# Patient Record
Sex: Male | Born: 1995 | Hispanic: Yes | Marital: Single | Smoking: Never smoker
Health system: Southern US, Community
[De-identification: ages and names within clinical notes are randomized; demographics above are authoritative.]

---
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
## 2016-06-06 ENCOUNTER — Ambulatory Visit: Payer: Self-pay | Admitting: Dietician

## 2016-07-22 ENCOUNTER — Encounter: Payer: Self-pay | Admitting: Dietician

## 2016-07-22 ENCOUNTER — Encounter: Payer: BLUE CROSS/BLUE SHIELD | Attending: Pediatrics | Admitting: Dietician

## 2016-07-22 VITALS — Ht 69.0 in | Wt 261.4 lb

## 2016-07-22 DIAGNOSIS — E669 Obesity, unspecified: Secondary | ICD-10-CM

## 2016-07-22 NOTE — Progress Notes (Signed)
Medical Nutrition Therapy: Visit start time: 8:30   end time: 9:30 Assessment:  Diagnosis: obesity Psychosocial issues/ stress concerns: none identified Preferred learning method:  . Auditory . Hands-on  Current weight: 261.4 lbs  Height: 69 in Medications, supplements: none Progress and evaluation:   Patient accompanied by his mother in for initial medical nutrition therapy assessment. Patient reports his rate of weight gain increased in middle school. He has not made any diet changes. He identifies a high intake of "Fast Foods" as a problem area. He works 3rd shift and often picks up fast food on his way home from work and often again for his dinner meal as well as meal while at work. He drinks water at home but tea or soda with "take out" meals.  His mother expressed a concern about his weight due to strong family history of diabetes; both of her parents and her brother and sister. She prepares an evening meal, but he usually chooses to pick up "Fast Food". He rates his motivation to make diet changes as 0-1 on a 0-10 scale but by the end of the session, he willingly set some goals. His present diet is low in fruits, vegetables and  whole grains and is adequate in protein and excessive in fat. He likes very few vegetables but states he loves Physical activity:  Asiyah has been going with his mother to the park to walk on a daily basis for 1-2 hours.  Dietary Intake:  Usual eating pattern includes 3 meals and 1-2 snacks per day. Dining out frequency: 11-75meals per week.  Breakfast: 7:30- sausage biscuit, hash browns, or.juice Goes to park to walk. Sleeps from 10:00- 3:00pm Dinner: 4-5:00pm- fish or shrimp, rice or tacos at home or [employer], 4-5 days/week ([employer], [employer], Cook-out, all with fried foods, tea or soda Meal at work, 1:00am- Fast Foods-4-5 nights per week or leftovers from home Beverages: water or 2% milk at home, sweet tea and soda when eating "out".  Nutrition Care  Education: Weight control: Discussed how goals for change have to be set by patient. Discussed how a rigid restrictive diet will not work long term and recommended to start with present intake and preferences and begin process of mindful eating. Discussed how positive diet and exercise changes can  decrease risk of diabetes in addition to aiding in weight loss efforts.  Meal by meal, discussed realistic changes that patient felt he could make to lower fat calories and also increase fruits/vegetables.  Nutritional Diagnosis:  [place]-3.3 Overweight/obesity As related to frequent intake of high fat "fast foods" and sweetened beverages.  As evidenced by diet history..  Intervention:  The following are the changes that [facility] felt he could realistically make: Walk daily for 1 1/2 to 2 hours. Increase water and eliminate sweetened beverages. Choose lower fat fast foods such as egg on English muffin in place of sausage biscuit. If having one high fat fast food, bring home and balance with fruit, celery (or any other vegetable). Add a glass of milk. Also, after mother asked about the hospital's [employer] program, gave [place] a coupon he can use for a "free week" to see if he is interested in the program.  Insurance underwriter given:  . Food lists/ Planning A Balanced Meal . Sample meal pattern/ menus . Goals/ instructions Learner/ who was taught:  . Patient  . Family member: mother Level of understanding: Verbalizes understanding Learning barriers: . None  Willingness to learn/ readiness for change: . Hesitance, contemplating  change Monitoring and Evaluation:  Dietary intake, exercise, , and body weight      follow up: 08/19/16 at 8:15am

## 2016-07-22 NOTE — Patient Instructions (Signed)
Walk daily for 1 1/2 to 2 hours. Increase water and eliminate sweetened beverages. Choose lower fat fast foods such as egg on English muffin in place of sausage biscuit. If having one high fat fast food, bring home and balance with fruit, celery (or any other vegetable). Add a glass of milk.

## 2016-08-19 ENCOUNTER — Ambulatory Visit: Payer: BLUE CROSS/BLUE SHIELD | Admitting: Dietician

## 2016-08-25 ENCOUNTER — Encounter: Payer: Self-pay | Admitting: Dietician

## 2016-08-25 ENCOUNTER — Encounter: Payer: BLUE CROSS/BLUE SHIELD | Attending: Pediatrics | Admitting: Dietician

## 2016-08-25 VITALS — Ht 69.0 in | Wt 256.5 lb

## 2016-08-25 DIAGNOSIS — E669 Obesity, unspecified: Secondary | ICD-10-CM

## 2016-08-25 NOTE — Progress Notes (Signed)
Medical Nutrition Therapy Follow-up visit:  Time with patient: 9:00am-9:30am Visit #:2 ASSESSMENT:  Diagnosis:obesity  Current weight:256.5    Height:69 in Medications: See list  Progress and evaluation: Patient in for medical nutrition therapy follow-up. He has followed through with goal set to eliminate sugar sweetened beverages and water is his main beverage.He is now working 2nd shift and is no longer picking up "fast foods" and is taking a meal that his mother has prepared for his meal break at work. He has increase fruit intake to 3-4 servings per day. He rarely includes vegetables as celery is the only vegetable he likes. He drinks approx. 3 cups of milk per day; switching from 2% milk to 1%.  He followed through to [facility] with exercise specialist [facility] but stated he preferred to check into some other gyms in the area. He and his mother are walking daily, from 1.5 to 2 hours. He has begun to run some of the laps to increase his intensity.  He has lost 5 lbs since his initial visit 1 month ago.   NUTRITION CARE EDUCATION:  Weight control:  Commended patient on the positive diet and exercise changes he has made as well as weight loss.  Reviewed his present meal pattern and compared to  food group servings needed to meet basic nutrient needs. Discussed how tasting vegetables when available will over time increase chance of acceptance. Discussed again considering strong family history of diabetes how his diet and exercise habits are helping to decrease risk of diabetes.   INTERVENTION:  Patient set goal to lose 5 more lbs. Continue to taste different vegetables to help increase acceptance. Continue with at least 4 servings of fruit spread over the day. Continue with daily exercise of walking 1.5 to 2 hours daily.  EDUCATION MATERIALS GIVEN:  . Goals/ instructions  LEARNER/ who was taught:  . Patient  LEVEL OF UNDERSTANDING: . Verbalizes/ demonstrates  competency LEARNING BARRIERS: . None  WILLINGNESS TO LEARN/READINESS FOR CHANGE: . Eager, change in progress  MONITORING AND EVALUATION:  Diet, exercise, weight  Follow-up: 09/22/16 at 9:00am

## 2016-08-25 NOTE — Patient Instructions (Addendum)
Patient set goal to lose 5 more lbs. Continue to taste different vegetables to help increase acceptance. Continue with at least 4 servings of fruit spread over the day. Continue with daily exercise of walking 1.5 to 2 hours daily.

## 2016-09-22 ENCOUNTER — Encounter: Payer: Self-pay | Admitting: Dietician

## 2016-09-22 ENCOUNTER — Encounter: Payer: BLUE CROSS/BLUE SHIELD | Attending: Pediatrics | Admitting: Dietician

## 2016-09-22 VITALS — Ht 69.0 in | Wt 252.4 lb

## 2016-09-22 DIAGNOSIS — E6609 Other obesity due to excess calories: Secondary | ICD-10-CM

## 2016-09-22 DIAGNOSIS — Z68.41 Body mass index (BMI) pediatric, greater than or equal to 95th percentile for age: Secondary | ICD-10-CM

## 2016-09-22 DIAGNOSIS — Z6837 Body mass index (BMI) 37.0-37.9, adult: Principal | ICD-10-CM

## 2016-09-22 NOTE — Progress Notes (Signed)
Medical Nutrition Therapy Follow-up visit:  Time with patient: 9:15 -10:00am Visit #:3 ASSESSMENT:  Diagnosis: obesity  Current weight:252 lbs    Height:69in Medications: See list  Progress and evaluation: Patient in for medical nutrition therapy follow-up.He reports that he has followed through with previous goals with exception of trying more vegetables. He continues to drink mainly water, eats very few "fast foods" and eats more of the meals his mother prepares. He includes 3-4 servings of fruit daily. He has lost 4.5 lbs in the past month.  Physical activity:  He joined a gym and is going at least 6 days per week for 2 hours.  NUTRITION CARE EDUCATION:  Weight control:  Obtained and reviewed typical meal pattern and food choices. Commended patient on the continued positive diet changes he has made. Discussed how his current rate of weight loss of 3/4 to 1 lb per week is a good rate even though he would like to lose weight faster. Discussed benefits and sources of whole grains. Commended also on the consistent structured exercise he is doing and discussed how exercise will be a key to long term weight loss/maintenance success.  INTERVENTION: Set goal again of trying vegetables. Continue with previous goals. Whole grains: eventual goal of 3 servings per day. 1 slice whole wheat bread (100% whole wheat or whole grain) 1/4 cup oatmeal dry or 1/2 cup cooked 1/2 cup cooked brown rice 1/2 cup cooked corn or 1 large ear of corn   EDUCATION MATERIALS GIVEN:  . Goals/ instructions  LEARNER/ who was taught:  . Patient  LEVEL OF UNDERSTANDING: . Verbalizes/ demonstrates competency LEARNING BARRIERS: . None  WILLINGNESS TO LEARN/READINESS FOR CHANGE: . Eager, change in progress MONITORING AND EVALUATION: 10/25/16

## 2016-09-22 NOTE — Patient Instructions (Signed)
Set goal again of trying vegetables. Continue with previous goals. Whole grains: eventual goal of 3 servings per day. 1 slice whole wheat bread (100% whole wheat or whole grain) 1/4 cup oatmeal dry or 1/2 cup cooked 1/2 cup cooked brown rice 1/2 cup cooked corn or 1 large ear of corn

## 2016-10-25 ENCOUNTER — Encounter: Payer: Self-pay | Admitting: Dietician

## 2016-10-25 ENCOUNTER — Encounter: Payer: BLUE CROSS/BLUE SHIELD | Attending: Pediatrics | Admitting: Dietician

## 2016-10-25 VITALS — Ht 69.0 in | Wt 251.8 lb

## 2016-10-25 DIAGNOSIS — Z68.41 Body mass index (BMI) pediatric, greater than or equal to 95th percentile for age: Secondary | ICD-10-CM

## 2016-10-25 DIAGNOSIS — Z6837 Body mass index (BMI) 37.0-37.9, adult: Principal | ICD-10-CM

## 2016-10-25 DIAGNOSIS — E6609 Other obesity due to excess calories: Secondary | ICD-10-CM | POA: Diagnosis not present

## 2016-10-25 NOTE — Patient Instructions (Signed)
Continue with previous goals. Continue to try vegetables "to get used to them". Continue to take fruit to work, 2 servings when possible. Resume gym schedule of 6 days per week for 2 hours.

## 2016-10-25 NOTE — Progress Notes (Signed)
Medical Nutrition Therapy Follow-up visit:  Time with patient: 9-9:30am Visit #:4 ASSESSMENT:  Diagnosis:obesity  Current weight:251.8 lbs    Height: 69 in Medications: none  Progress and evaluation: Patient in for medical nutrition therapy follow-up. John Harris states that John Harris has not been as consistent with exercise. John Harris has continued positive diet changes of limiting "fast foods" and drinking water as main beverage in place of sweet tea and soda. John Harris has a plate that is portioned that John Harris takes to work with foods his mother has prepared. His mother has switched to brown rice and is also buying whole grain bread in an effort to increase whole grains. 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 John Harris does take fruit to work.    NUTRITION CARE EDUCATION:   Weight control:   Discussed again how strategies and mindful eating are more effective than strict dieting. Commended on the diet changes John Harris has made and encouraged again to focus on foods needed to meet nutrient needs and reviewed basic food group servings needed. INTERVENTION: Continue with previous goals. Continue to try vegetables "to get used to them". Continue to take fruit to work, 2 servings when possible. Resume gym schedule of 6 days per week for 2 hours.  EDUCATION MATERIALS GIVEN:  . Goals/ instructions  LEARNER/ who was taught:  . Patient   LEVEL OF UNDERSTANDING: . Verbalizes understanding LEARNING BARRIERS: . None WILLINGNESS TO LEN/READINESS FOR CHANGE: . Change in progress  MONITORING AND EVALUATION:  11/22/16 at 9:00am

## 2016-11-29 ENCOUNTER — Ambulatory Visit: Payer: BLUE CROSS/BLUE SHIELD | Admitting: Dietician

## 2016-12-02 ENCOUNTER — Encounter: Payer: Self-pay | Admitting: Dietician

## 2023-02-16 ENCOUNTER — Ambulatory Visit: Payer: Self-pay | Admitting: Family

## 2023-02-16 DIAGNOSIS — Z113 Encounter for screening for infections with a predominantly sexual mode of transmission: Secondary | ICD-10-CM

## 2023-02-16 NOTE — Progress Notes (Signed)
Pt stated that he went to another facility to be checked for "bumps' on his groin and STI testing.  Per pt, all of his STI "Herpes, HIV and all the other testing" was negative, however, his "blood sugar" came back high and he was here to have it retested.  Informed patient that we were unable to repeat a blood glucose and he would need to have that done at his PCP.  Pt states that he does not have a PCP and asked for a list. PCP list and an [facility] application given to the patient.  Pt was not seen by a Provider.  Windle Guard, RN

## 2023-04-13 DIAGNOSIS — E119 Type 2 diabetes mellitus without complications: Secondary | ICD-10-CM | POA: Diagnosis not present

## 2023-04-13 DIAGNOSIS — Z1329 Encounter for screening for other suspected endocrine disorder: Secondary | ICD-10-CM | POA: Diagnosis not present

## 2023-04-13 DIAGNOSIS — Z1389 Encounter for screening for other disorder: Secondary | ICD-10-CM | POA: Diagnosis not present

## 2023-04-21 DIAGNOSIS — E119 Type 2 diabetes mellitus without complications: Secondary | ICD-10-CM | POA: Diagnosis not present

## 2023-05-09 DIAGNOSIS — Z712 Person consulting for explanation of examination or test findings: Secondary | ICD-10-CM | POA: Diagnosis not present

## 2023-05-09 DIAGNOSIS — E119 Type 2 diabetes mellitus without complications: Secondary | ICD-10-CM | POA: Diagnosis not present

## 2023-05-09 DIAGNOSIS — Z1389 Encounter for screening for other disorder: Secondary | ICD-10-CM | POA: Diagnosis not present
